# Patient Record
Sex: Female | Born: 1975 | Race: White | Hispanic: No | Marital: Single | Smoking: Never smoker
Health system: Southern US, Community
[De-identification: ages and names within clinical notes are randomized; demographics above are authoritative.]

---

## 1997-06-18 ENCOUNTER — Ambulatory Visit (HOSPITAL_COMMUNITY): Admission: RE | Admit: 1997-06-18 | Discharge: 1997-06-18 | Payer: Self-pay | Admitting: *Deleted

## 1997-08-18 ENCOUNTER — Ambulatory Visit (HOSPITAL_COMMUNITY): Admission: RE | Admit: 1997-08-18 | Discharge: 1997-08-18 | Payer: Self-pay | Admitting: *Deleted

## 1997-08-23 ENCOUNTER — Encounter: Admission: RE | Admit: 1997-08-23 | Discharge: 1997-11-21 | Payer: Self-pay | Admitting: *Deleted

## 1997-08-23 ENCOUNTER — Inpatient Hospital Stay (HOSPITAL_COMMUNITY): Admission: AD | Admit: 1997-08-23 | Discharge: 1997-08-23 | Payer: Self-pay | Admitting: Obstetrics

## 1997-09-06 ENCOUNTER — Inpatient Hospital Stay (HOSPITAL_COMMUNITY): Admission: AD | Admit: 1997-09-06 | Discharge: 1997-09-06 | Payer: Self-pay | Admitting: Obstetrics

## 1997-09-17 ENCOUNTER — Inpatient Hospital Stay (HOSPITAL_COMMUNITY): Admission: AD | Admit: 1997-09-17 | Discharge: 1997-09-17 | Payer: Self-pay | Admitting: *Deleted

## 1997-09-20 ENCOUNTER — Ambulatory Visit (HOSPITAL_COMMUNITY): Admission: RE | Admit: 1997-09-20 | Discharge: 1997-09-20 | Payer: Self-pay | Admitting: Obstetrics

## 1997-10-06 ENCOUNTER — Inpatient Hospital Stay (HOSPITAL_COMMUNITY): Admission: AD | Admit: 1997-10-06 | Discharge: 1997-10-08 | Payer: Self-pay | Admitting: *Deleted

## 1998-05-24 ENCOUNTER — Other Ambulatory Visit: Admission: RE | Admit: 1998-05-24 | Discharge: 1998-05-24 | Payer: Self-pay | Admitting: *Deleted

## 1998-07-05 ENCOUNTER — Ambulatory Visit (HOSPITAL_COMMUNITY): Admission: RE | Admit: 1998-07-05 | Discharge: 1998-07-05 | Payer: Self-pay | Admitting: *Deleted

## 1998-08-30 ENCOUNTER — Ambulatory Visit (HOSPITAL_COMMUNITY): Admission: RE | Admit: 1998-08-30 | Discharge: 1998-08-30 | Payer: Self-pay | Admitting: Obstetrics

## 1998-10-05 ENCOUNTER — Ambulatory Visit (HOSPITAL_COMMUNITY): Admission: RE | Admit: 1998-10-05 | Discharge: 1998-10-05 | Payer: Self-pay | Admitting: *Deleted

## 1998-10-11 ENCOUNTER — Inpatient Hospital Stay (HOSPITAL_COMMUNITY): Admission: AD | Admit: 1998-10-11 | Discharge: 1998-10-11 | Payer: Self-pay | Admitting: *Deleted

## 1998-10-12 ENCOUNTER — Encounter (HOSPITAL_COMMUNITY): Admission: RE | Admit: 1998-10-12 | Discharge: 1998-12-31 | Payer: Self-pay | Admitting: Obstetrics

## 1998-10-31 ENCOUNTER — Inpatient Hospital Stay (HOSPITAL_COMMUNITY): Admission: AD | Admit: 1998-10-31 | Discharge: 1998-10-31 | Payer: Self-pay | Admitting: Obstetrics

## 1998-11-04 ENCOUNTER — Inpatient Hospital Stay (HOSPITAL_COMMUNITY): Admission: AD | Admit: 1998-11-04 | Discharge: 1998-11-04 | Payer: Self-pay | Admitting: Obstetrics & Gynecology

## 1998-11-05 ENCOUNTER — Inpatient Hospital Stay (HOSPITAL_COMMUNITY): Admission: AD | Admit: 1998-11-05 | Discharge: 1998-11-05 | Payer: Self-pay | Admitting: Obstetrics

## 1998-12-09 ENCOUNTER — Ambulatory Visit (HOSPITAL_COMMUNITY): Admission: RE | Admit: 1998-12-09 | Discharge: 1998-12-09 | Payer: Self-pay | Admitting: Obstetrics

## 1998-12-10 ENCOUNTER — Inpatient Hospital Stay (HOSPITAL_COMMUNITY): Admission: AD | Admit: 1998-12-10 | Discharge: 1998-12-10 | Payer: Self-pay | Admitting: *Deleted

## 1998-12-22 ENCOUNTER — Inpatient Hospital Stay (HOSPITAL_COMMUNITY): Admission: AD | Admit: 1998-12-22 | Discharge: 1998-12-22 | Payer: Self-pay | Admitting: Obstetrics

## 1998-12-24 ENCOUNTER — Observation Stay (HOSPITAL_COMMUNITY): Admission: AD | Admit: 1998-12-24 | Discharge: 1998-12-25 | Payer: Self-pay | Admitting: Obstetrics

## 1998-12-26 ENCOUNTER — Inpatient Hospital Stay (HOSPITAL_COMMUNITY): Admission: AD | Admit: 1998-12-26 | Discharge: 1998-12-26 | Payer: Self-pay | Admitting: *Deleted

## 1998-12-29 ENCOUNTER — Inpatient Hospital Stay (HOSPITAL_COMMUNITY): Admission: AD | Admit: 1998-12-29 | Discharge: 1998-12-31 | Payer: Self-pay | Admitting: Obstetrics & Gynecology

## 1999-03-04 ENCOUNTER — Emergency Department (HOSPITAL_COMMUNITY): Admission: EM | Admit: 1999-03-04 | Discharge: 1999-03-04 | Payer: Self-pay | Admitting: Emergency Medicine

## 1999-03-11 ENCOUNTER — Emergency Department (HOSPITAL_COMMUNITY): Admission: EM | Admit: 1999-03-11 | Discharge: 1999-03-11 | Payer: Self-pay | Admitting: Emergency Medicine

## 2002-06-22 ENCOUNTER — Encounter: Admission: RE | Admit: 2002-06-22 | Discharge: 2002-06-22 | Payer: Self-pay | Admitting: Surgery

## 2002-06-22 ENCOUNTER — Encounter: Payer: Self-pay | Admitting: Surgery

## 2003-12-02 ENCOUNTER — Encounter: Admission: RE | Admit: 2003-12-02 | Discharge: 2003-12-02 | Payer: Self-pay | Admitting: Surgery

## 2004-07-15 ENCOUNTER — Inpatient Hospital Stay (HOSPITAL_COMMUNITY): Admission: AD | Admit: 2004-07-15 | Discharge: 2004-07-15 | Payer: Self-pay | Admitting: Obstetrics and Gynecology

## 2004-10-13 ENCOUNTER — Ambulatory Visit (HOSPITAL_COMMUNITY): Admission: RE | Admit: 2004-10-13 | Discharge: 2004-10-13 | Payer: Self-pay | Admitting: *Deleted

## 2004-10-26 ENCOUNTER — Ambulatory Visit (HOSPITAL_COMMUNITY): Admission: RE | Admit: 2004-10-26 | Discharge: 2004-10-26 | Payer: Self-pay | Admitting: *Deleted

## 2004-12-06 ENCOUNTER — Ambulatory Visit: Payer: Self-pay | Admitting: *Deleted

## 2004-12-06 ENCOUNTER — Inpatient Hospital Stay (HOSPITAL_COMMUNITY): Admission: AD | Admit: 2004-12-06 | Discharge: 2004-12-06 | Payer: Self-pay | Admitting: Obstetrics and Gynecology

## 2004-12-11 ENCOUNTER — Ambulatory Visit: Payer: Self-pay | Admitting: Obstetrics and Gynecology

## 2004-12-11 ENCOUNTER — Inpatient Hospital Stay (HOSPITAL_COMMUNITY): Admission: AD | Admit: 2004-12-11 | Discharge: 2004-12-11 | Payer: Self-pay | Admitting: Obstetrics & Gynecology

## 2004-12-13 ENCOUNTER — Ambulatory Visit: Payer: Self-pay | Admitting: *Deleted

## 2004-12-13 ENCOUNTER — Inpatient Hospital Stay (HOSPITAL_COMMUNITY): Admission: AD | Admit: 2004-12-13 | Discharge: 2004-12-16 | Payer: Self-pay | Admitting: Obstetrics and Gynecology

## 2004-12-20 ENCOUNTER — Ambulatory Visit: Payer: Self-pay | Admitting: *Deleted

## 2004-12-27 ENCOUNTER — Ambulatory Visit: Payer: Self-pay | Admitting: Family Medicine

## 2005-01-03 ENCOUNTER — Ambulatory Visit: Payer: Self-pay | Admitting: Obstetrics & Gynecology

## 2005-01-10 ENCOUNTER — Ambulatory Visit: Payer: Self-pay | Admitting: *Deleted

## 2005-01-10 ENCOUNTER — Ambulatory Visit (HOSPITAL_COMMUNITY): Admission: RE | Admit: 2005-01-10 | Discharge: 2005-01-10 | Payer: Self-pay | Admitting: Obstetrics and Gynecology

## 2005-01-13 IMAGING — CT CT ABDOMEN W/ CM
1 of 3 series · 14 of 32 positions shown, 19 images · IV contrast (GASTRO & OMNIPAQUE [ID])
Comparison: none

CLINICAL DATA: History of midabdomen pain.  Evaluate for possible hernia.  
 CT SCAN OF THE ABDOMEN WITH CONTRAST AND CT OF THE PELVIS WITH CONTRAST 
 CT ABDOMEN 
 Multidetector helical scans through the abdomen were performed after oral and IV contrast media were given.  100 cc of Omnipaque 300 were given as the contrast media. 
 The lung bases are clear.  The liver enhances normally with no focal abnormality and no ductal dilatation is seen.  No calcified gallstones are evident.  The pancreas is normal in size and the peripancreatic fat planes are normal.  The adrenal glands and spleen are normal.  Scanning through the kidneys, there are a few small foci of increased attenuation right greater than left most consistent with small renal calculi which are non-obstructing.  On delayed images the pelvocaliceal systems appear normal.  The abdominal aorta is normal in caliber and no adenopathy is seen.
 IMPRESSION
 1.  Negative CT of the abdomen for acute abnormality. 
 2.  Small non-obstructing renal calculi right greater than left.  
 CT PELVIS 
 Scans were continued through the pelvis after oral and IV contrast media were given.  The uterus is normal in size.  The urinary bladder is decompressed and cannot be evaluated.  Calcified phleboliths are noted in the pelvis.  Small rounded low attenuation areas in the adnexa are most consistent with small follicles.  The appendix is not seen with certainty.  The terminal ileum is normal.  No fluid is seen within the pelvis.  The rectosigmoid colon is grossly normal being decompressed.  No hernia is seen. 
 No acute abnormality on CT of the pelvis.  No hernia is seen.

[Series 2: routine abdomen · axial · 0.70mm/px · z∈[-386,-6]mm · 14 of 86 slices shown, 19 images]
[im 5/86  soft-tissue]
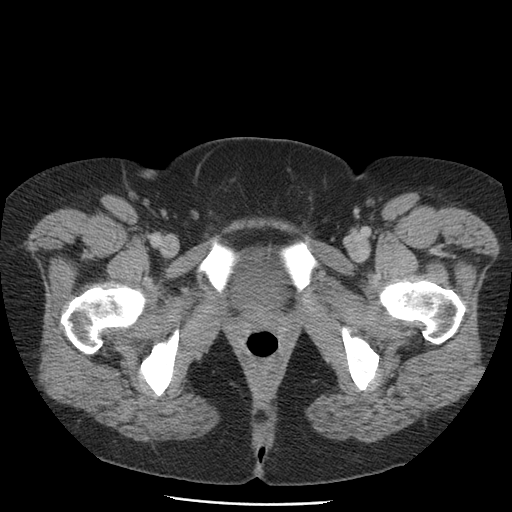
[im 5/86  bone]
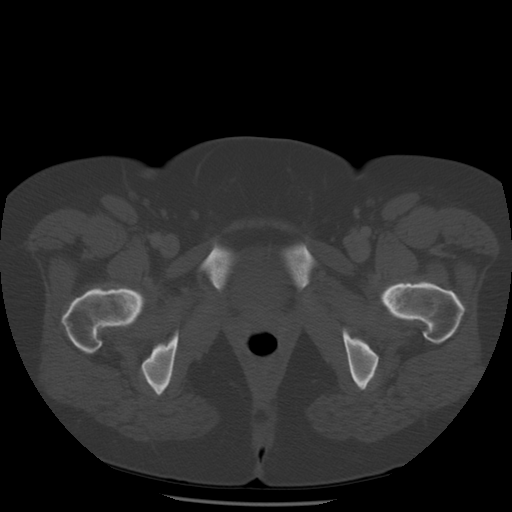
[im 13/86  soft-tissue]
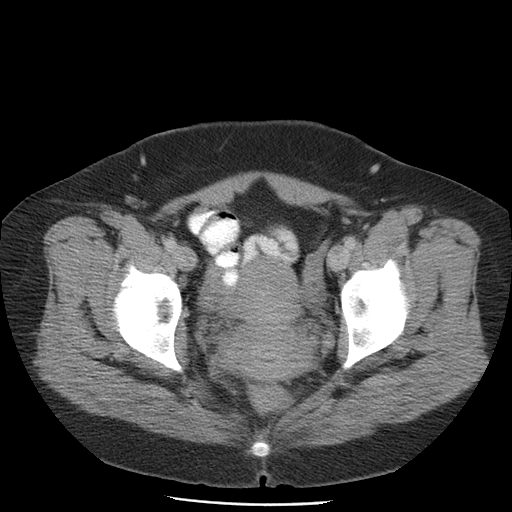
[im 18/86  soft-tissue]
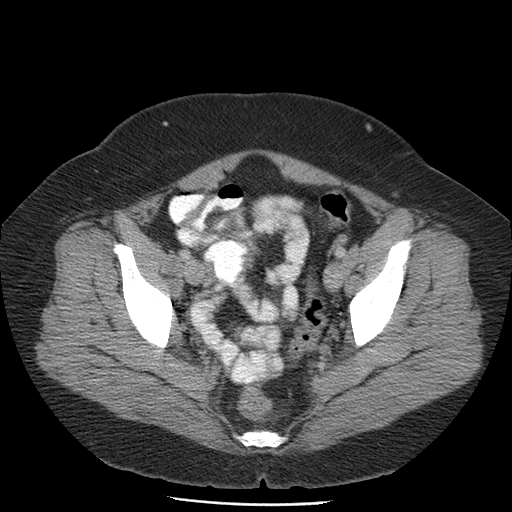
[im 26/86  soft-tissue]
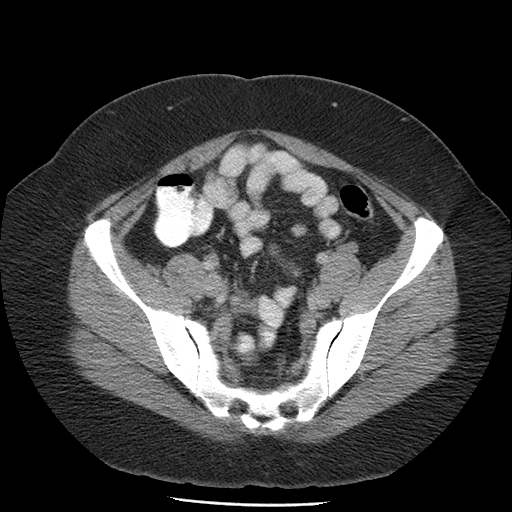
[im 30/86  soft-tissue]
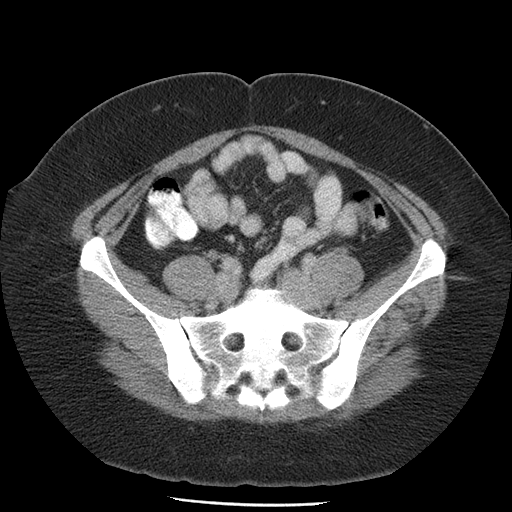
[im 39/86  soft-tissue]
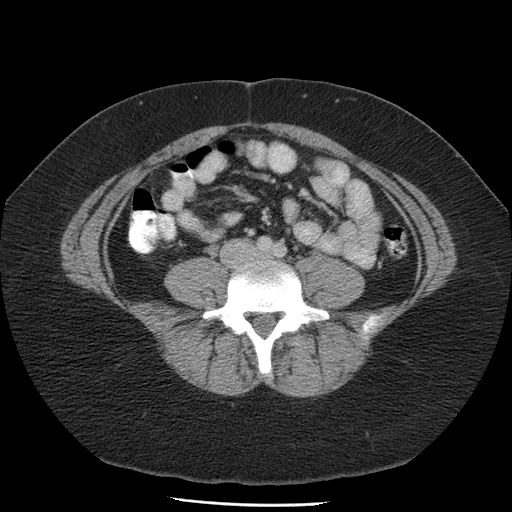
[im 43/86  soft-tissue]
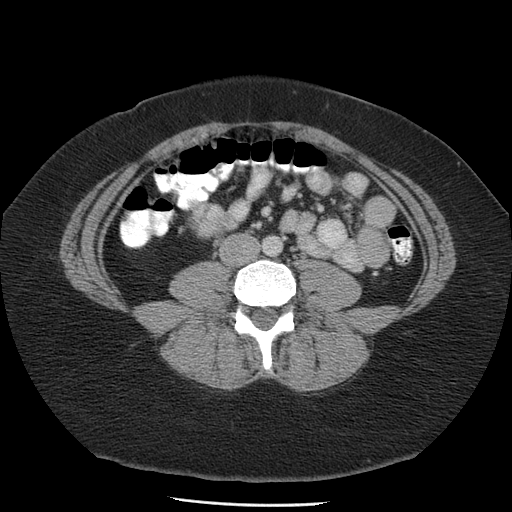
[im 47/86  soft-tissue]
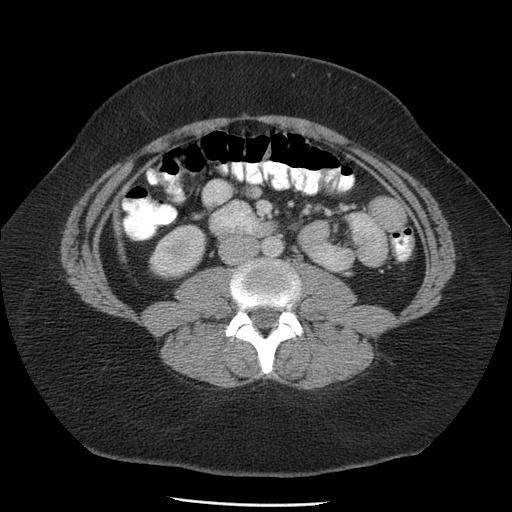
[im 56/86  soft-tissue]
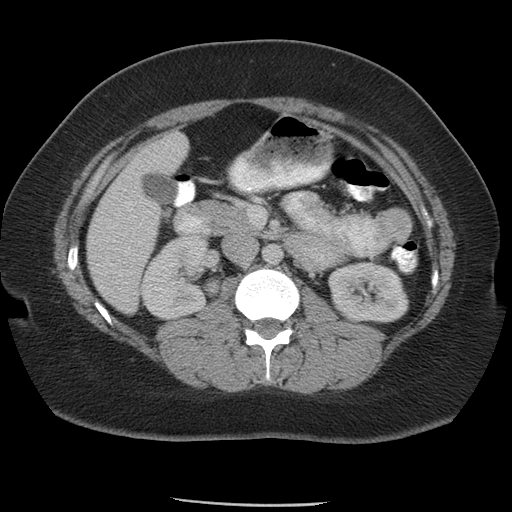
[im 56/86  bone]
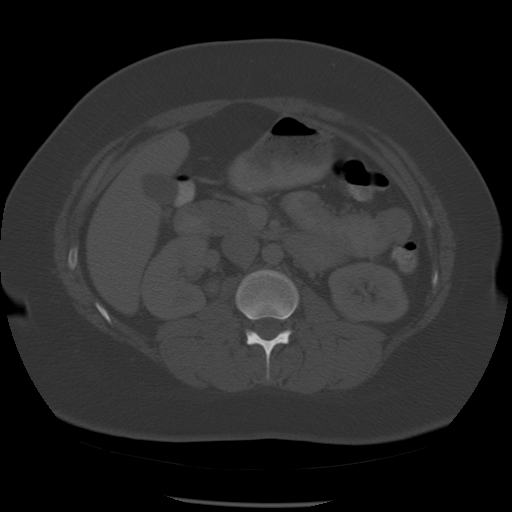
[im 60/86  soft-tissue]
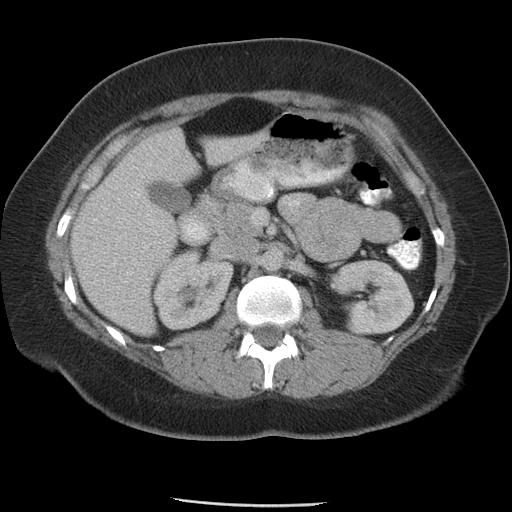
[im 69/86  soft-tissue]
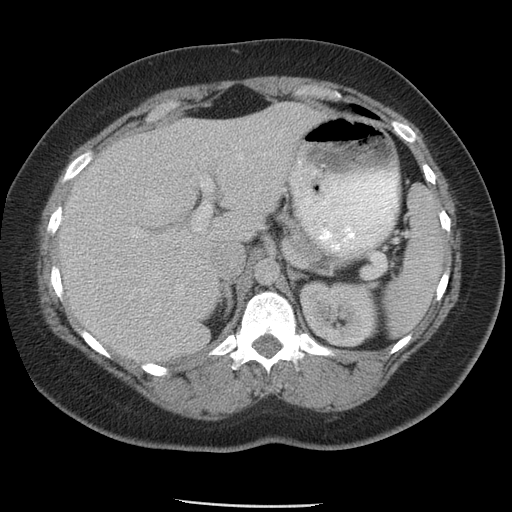
[im 69/86  lung]
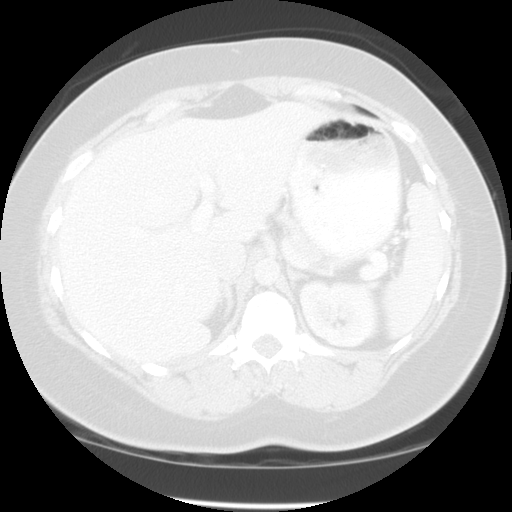
[im 73/86  soft-tissue]
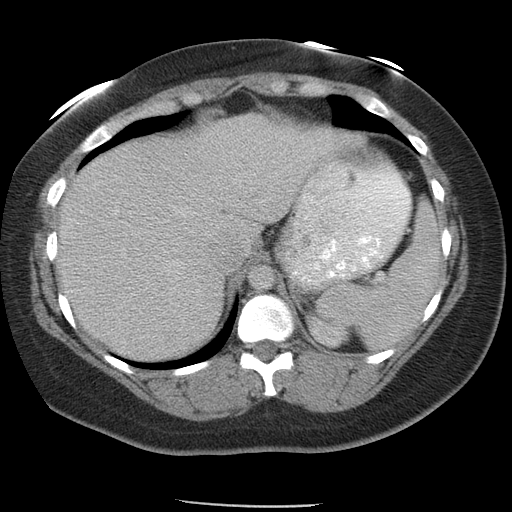
[im 73/86  lung]
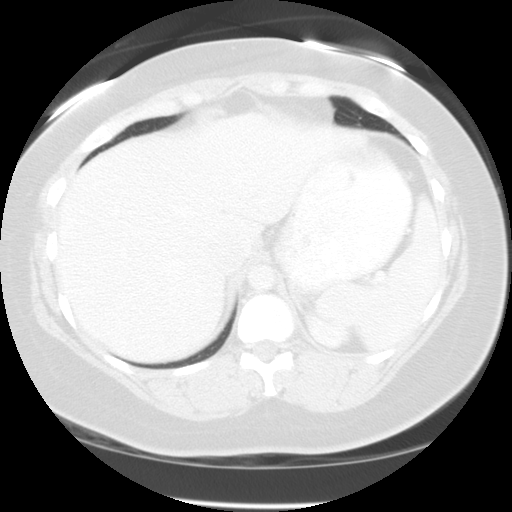
[im 77/86  lung]
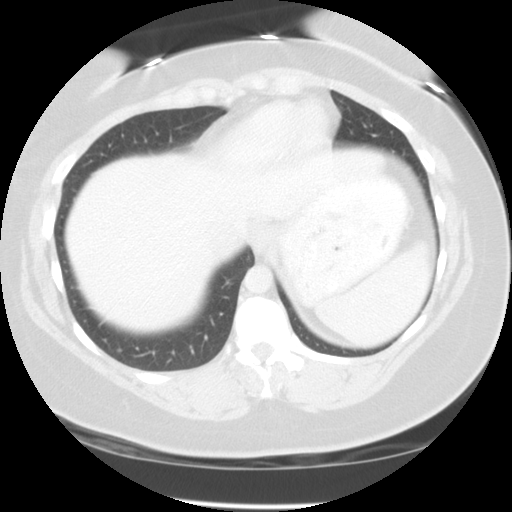
[im 81/86  soft-tissue]
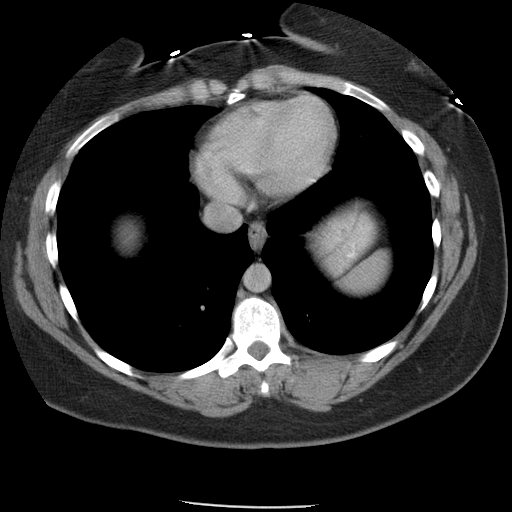
[im 81/86  lung]
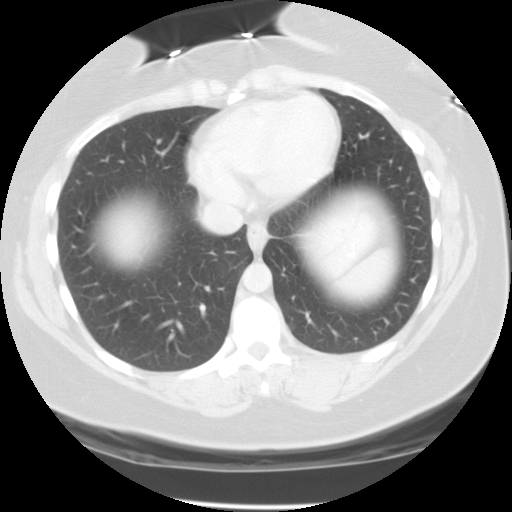

[14 of 32 positions shown; findings below may reference images not displayed]

## 2005-01-17 ENCOUNTER — Ambulatory Visit: Payer: Self-pay | Admitting: Obstetrics & Gynecology

## 2005-01-24 ENCOUNTER — Ambulatory Visit: Payer: Self-pay | Admitting: *Deleted

## 2005-02-07 ENCOUNTER — Ambulatory Visit: Payer: Self-pay | Admitting: *Deleted

## 2005-02-14 ENCOUNTER — Ambulatory Visit: Payer: Self-pay | Admitting: *Deleted

## 2005-02-15 ENCOUNTER — Ambulatory Visit (HOSPITAL_COMMUNITY): Admission: RE | Admit: 2005-02-15 | Discharge: 2005-02-15 | Payer: Self-pay | Admitting: *Deleted

## 2005-02-16 ENCOUNTER — Ambulatory Visit: Payer: Self-pay | Admitting: *Deleted

## 2005-02-18 ENCOUNTER — Ambulatory Visit: Payer: Self-pay | Admitting: Family Medicine

## 2005-02-18 ENCOUNTER — Inpatient Hospital Stay (HOSPITAL_COMMUNITY): Admission: AD | Admit: 2005-02-18 | Discharge: 2005-02-18 | Payer: Self-pay | Admitting: *Deleted

## 2005-02-21 ENCOUNTER — Ambulatory Visit: Payer: Self-pay | Admitting: Family Medicine

## 2005-02-23 ENCOUNTER — Ambulatory Visit: Payer: Self-pay | Admitting: *Deleted

## 2005-02-28 ENCOUNTER — Ambulatory Visit: Payer: Self-pay | Admitting: *Deleted

## 2005-03-02 ENCOUNTER — Ambulatory Visit: Payer: Self-pay | Admitting: *Deleted

## 2005-03-09 ENCOUNTER — Ambulatory Visit: Payer: Self-pay | Admitting: *Deleted

## 2005-03-09 ENCOUNTER — Inpatient Hospital Stay (HOSPITAL_COMMUNITY): Admission: AD | Admit: 2005-03-09 | Discharge: 2005-03-11 | Payer: Self-pay | Admitting: *Deleted

## 2006-01-18 IMAGING — US US OB FOLLOW-UP
1 series · 13 of 28 positions shown · non-contrast
Comparison: none

CLINICAL DATA: Contractions.  Premature labor.

[Series 1: us ob follow-up · 32 acquisitions, 13 frames shown]
[im 2/32]
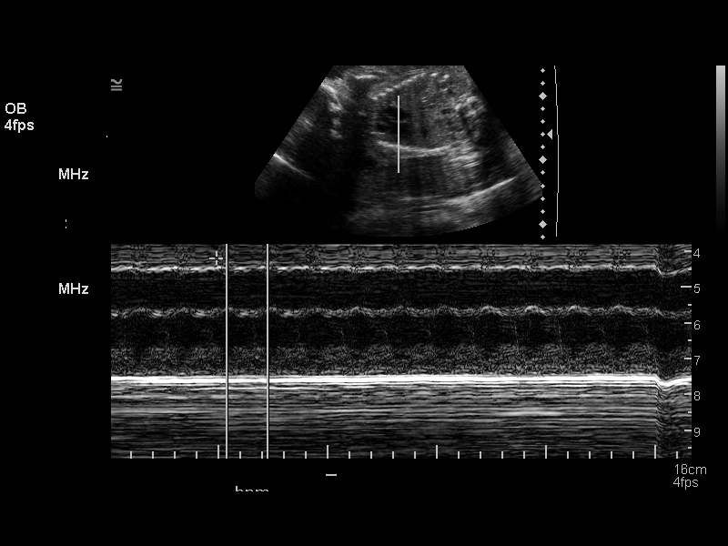
[im 4/32]
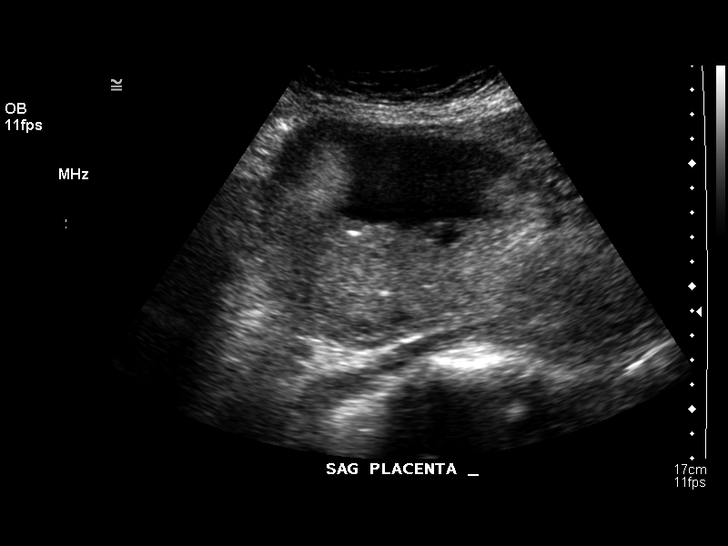
[im 6/32]
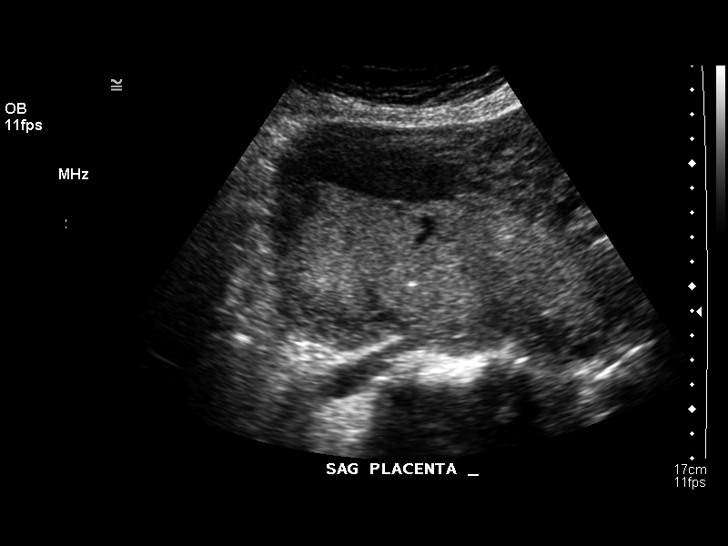
[im 9/32]
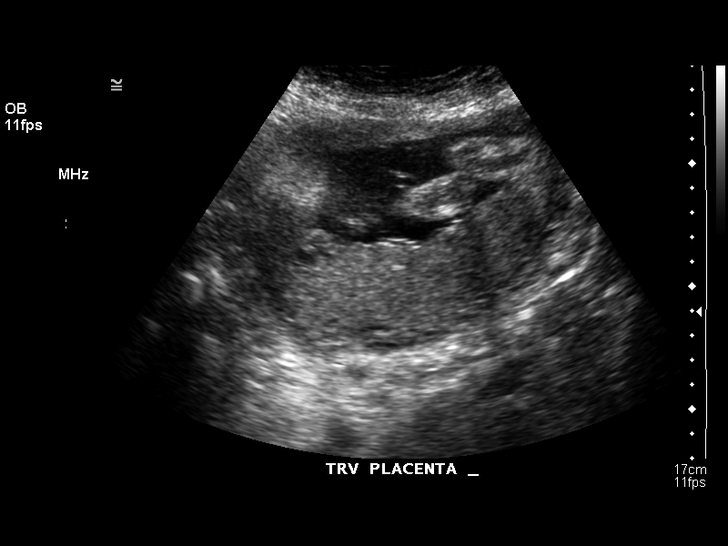
[im 11/32]
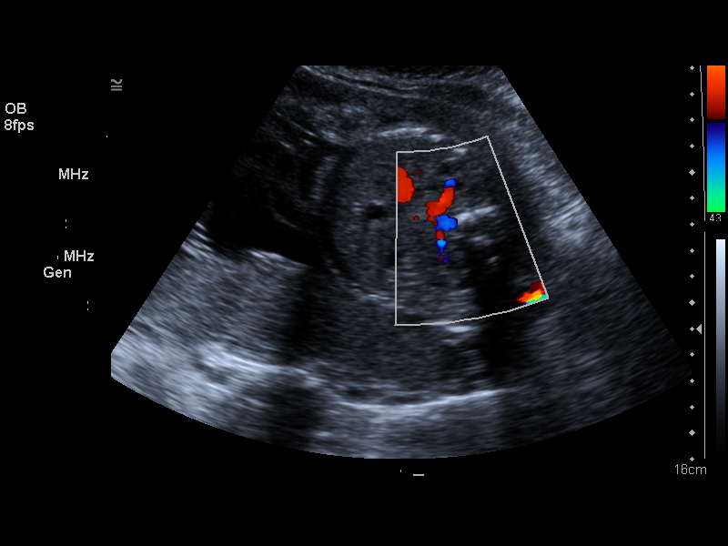
[im 13/32]
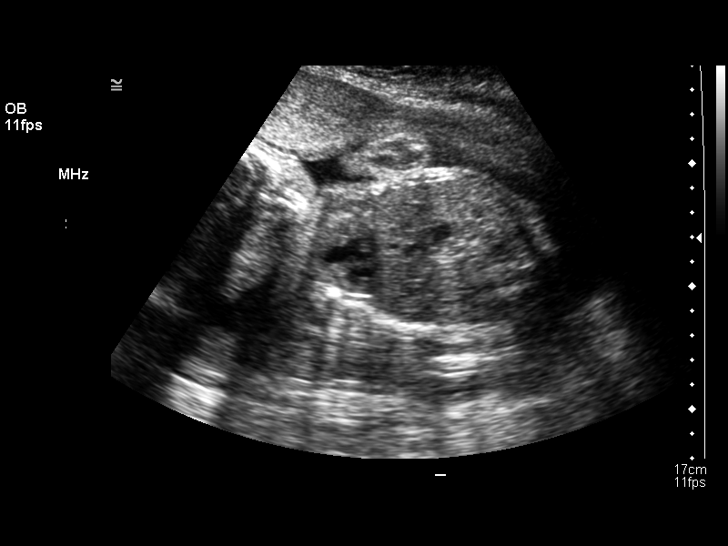
[im 17/32]
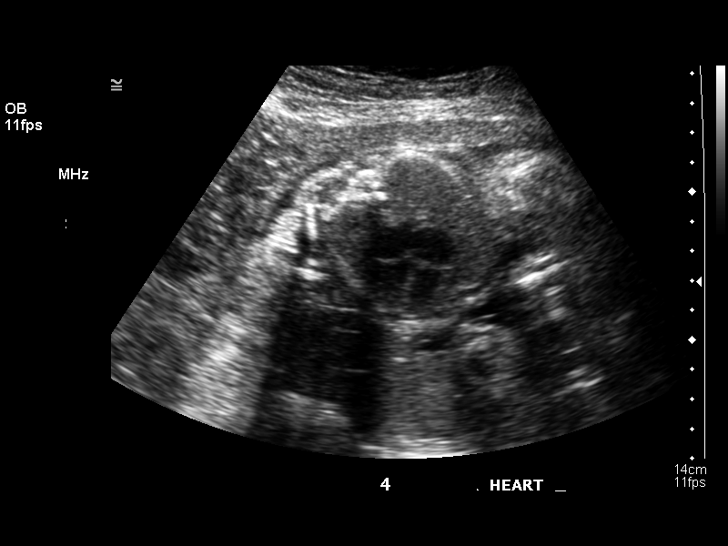
[im 19/32]
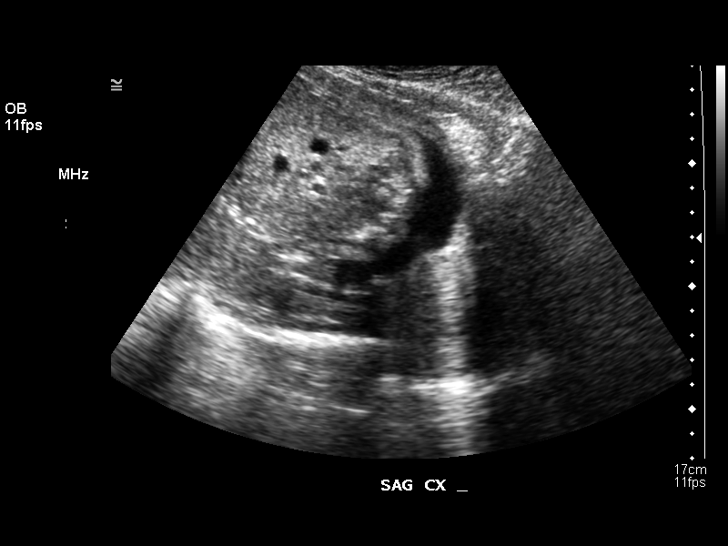
[im 21/32]
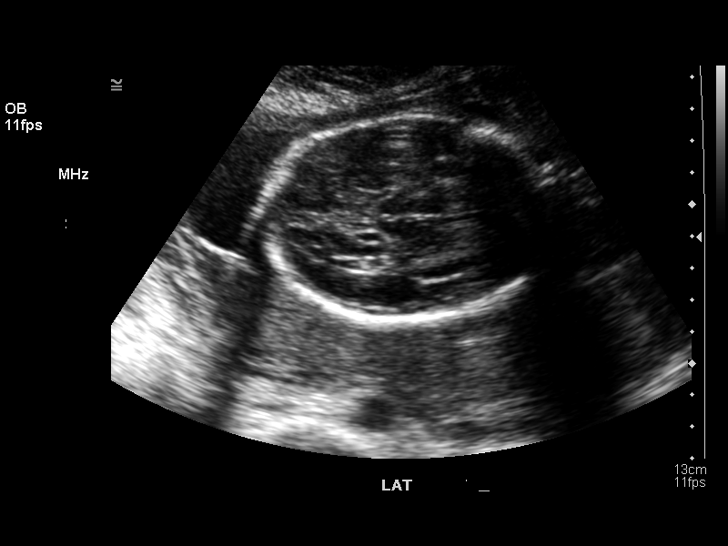
[im 23/32]
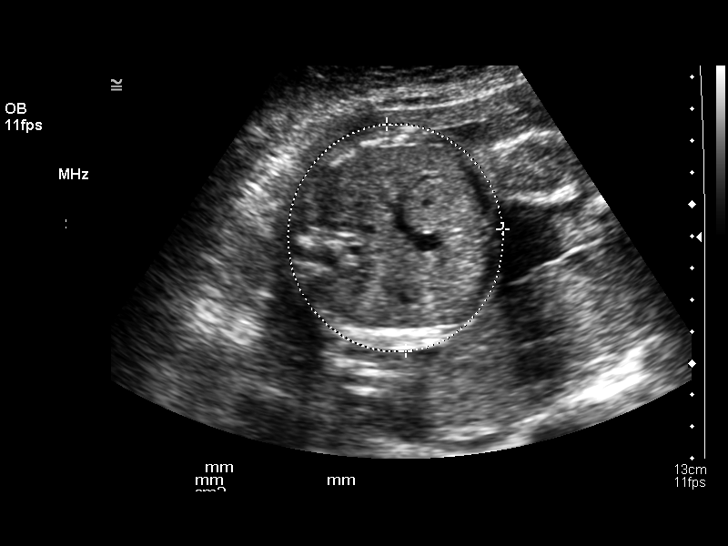
[im 26/32]
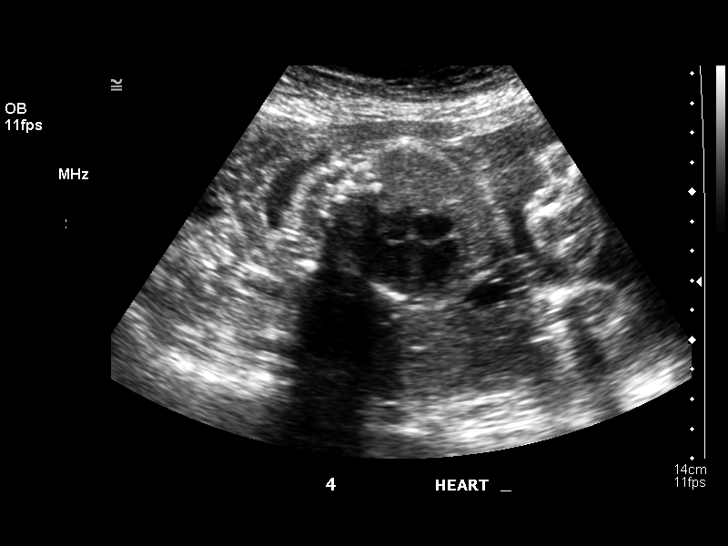
[im 28/32]
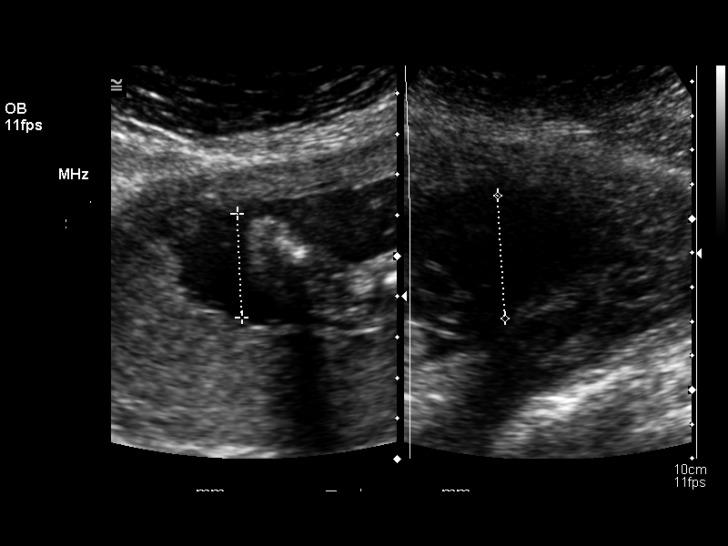
[im 30/32]
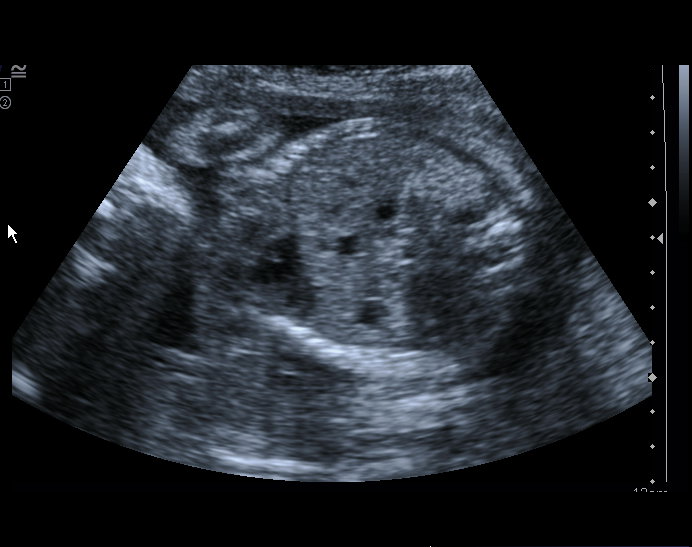

[13 of 28 positions shown; findings below may reference images not displayed]

OBSTETRICAL ULTRASOUND RE-EVALUATION:
 Number of Fetuses:  1
 Heart Rate:  160
 Movement:  Yes
 Breathing:  Yes
 Presentation:  Breech
 Placental Location:  Fundal, posterior
 Grade: I
 Previa:  No
 Amniotic Fluid (subjective):  Normal
 Amniotic Fluid (objective):  12.4 cm AFI (5th -95th%ile = 9.7 – 22.3 cm for 26 wks)

 FETAL BIOMETRY
 BPD:  6.4 cm  25 w 5 d
 HC:  25.4 cm   27 w 4 d
 AC:  21.5 cm  26 w 0 d
 FL:   4.8 cm  25 w 5 d

 Mean GA:  26 w 2 d
 Assigned GA:  26 w 0 d
 Fetal indices are within normal limits.
 EFW: 895 g (H) 75th – 90th%ile (885 – 3139 g) For 26 wks

 FETAL ANATOMY
 Lateral Ventricles:  Visualized 
 Thalami/CSP:  Visualized 
 Posterior Fossa:  Visualized 
 Nuchal Region:  Visualized 
 Spine:  Visualized 
 4 Chamber Heart on Left:  Visualized 
 Stomach on Left:  Visualized 
 3 Vessel Cord:  Visualized 
 Cord Insertion Site:  Visualized 
 Kidneys:  Visualized 
 Bladder:  Visualized 
 Extremities:  Visualized 

 MATERNAL UTERINE AND ADNEXAL FINDINGS
 Cervix:  4.7 cm Transabdominally

 BIOPHYSICAL PROFILE

 Movement:  2    Time:  30 minutes
 Breathing:  2
 Tone:  2
 Amniotic Fluid:  2

 Total Score:  8
IMPRESSION: 1.  Normal appearing single intrauterine pregnancy of approximately 26 weeks 2 days gestation.
 2.  Normal biophysical profile score [DATE].

## 2010-07-07 ENCOUNTER — Emergency Department (HOSPITAL_BASED_OUTPATIENT_CLINIC_OR_DEPARTMENT_OTHER)
Admission: EM | Admit: 2010-07-07 | Discharge: 2010-07-07 | Disposition: A | Discharge status: Home / Self Care | Payer: BC Managed Care – PPO | Attending: Emergency Medicine | Admitting: Emergency Medicine

## 2010-07-07 DIAGNOSIS — L0231 Cutaneous abscess of buttock: Secondary | ICD-10-CM | POA: Insufficient documentation

## 2010-07-07 DIAGNOSIS — K645 Perianal venous thrombosis: Secondary | ICD-10-CM | POA: Insufficient documentation

## 2010-07-07 DIAGNOSIS — L03317 Cellulitis of buttock: Secondary | ICD-10-CM | POA: Insufficient documentation

## 2013-11-09 ENCOUNTER — Other Ambulatory Visit (HOSPITAL_COMMUNITY)
Admission: RE | Admit: 2013-11-09 | Discharge: 2013-11-09 | Disposition: A | Discharge status: Home / Self Care | Payer: BC Managed Care – PPO | Source: Ambulatory Visit | Attending: Physician Assistant | Admitting: Physician Assistant

## 2013-11-09 ENCOUNTER — Other Ambulatory Visit: Payer: Self-pay | Admitting: Physician Assistant

## 2013-11-09 DIAGNOSIS — Z1151 Encounter for screening for human papillomavirus (HPV): Secondary | ICD-10-CM | POA: Insufficient documentation

## 2013-11-09 DIAGNOSIS — Z124 Encounter for screening for malignant neoplasm of cervix: Secondary | ICD-10-CM | POA: Insufficient documentation

## 2013-11-10 LAB — CYTOLOGY - PAP

## 2014-06-10 ENCOUNTER — Ambulatory Visit (INDEPENDENT_AMBULATORY_CARE_PROVIDER_SITE_OTHER): Payer: BC Managed Care – PPO | Admitting: Family Medicine

## 2014-06-10 VITALS — BP 124/88 | HR 80 | Temp 98.7°F | Resp 16 | Ht 68.0 in | Wt 207.6 lb

## 2014-06-10 DIAGNOSIS — N926 Irregular menstruation, unspecified: Secondary | ICD-10-CM

## 2014-06-10 DIAGNOSIS — R1912 Hyperactive bowel sounds: Secondary | ICD-10-CM

## 2014-06-10 DIAGNOSIS — R198 Other specified symptoms and signs involving the digestive system and abdomen: Secondary | ICD-10-CM

## 2014-06-10 DIAGNOSIS — R142 Eructation: Secondary | ICD-10-CM

## 2014-06-10 DIAGNOSIS — R110 Nausea: Secondary | ICD-10-CM

## 2014-06-10 DIAGNOSIS — Z113 Encounter for screening for infections with a predominantly sexual mode of transmission: Secondary | ICD-10-CM

## 2014-06-10 DIAGNOSIS — R1030 Lower abdominal pain, unspecified: Secondary | ICD-10-CM

## 2014-06-10 DIAGNOSIS — N3090 Cystitis, unspecified without hematuria: Secondary | ICD-10-CM

## 2014-06-10 LAB — POCT URINALYSIS DIPSTICK
GLUCOSE UA: NEGATIVE
KETONES UA: 15
Nitrite, UA: NEGATIVE
Protein, UA: 30
Urobilinogen, UA: 1
pH, UA: 5

## 2014-06-10 LAB — POCT CBC
GRANULOCYTE PERCENT: 78.2 % (ref 37–80)
HCT, POC: 43.7 % (ref 37.7–47.9)
Hemoglobin: 14.5 g/dL (ref 12.2–16.2)
Lymph, poc: 1.9 (ref 0.6–3.4)
MCH: 30.9 pg (ref 27–31.2)
MCHC: 33.2 g/dL (ref 31.8–35.4)
MCV: 93.2 fL (ref 80–97)
MID (CBC): 0.9 (ref 0–0.9)
MPV: 6.3 fL (ref 0–99.8)
PLATELET COUNT, POC: 302 10*3/uL (ref 142–424)
POC Granulocyte: 9.8 — AB (ref 2–6.9)
POC LYMPH PERCENT: 15 %L (ref 10–50)
POC MID %: 6.8 % (ref 0–12)
RBC: 4.69 M/uL (ref 4.04–5.48)
RDW, POC: 13.3 %
WBC: 12.5 10*3/uL — AB (ref 4.6–10.2)

## 2014-06-10 LAB — POCT URINE PREGNANCY: Preg Test, Ur: NEGATIVE

## 2014-06-10 LAB — POCT UA - MICROSCOPIC ONLY
CASTS, UR, LPF, POC: NEGATIVE
CRYSTALS, UR, HPF, POC: NEGATIVE
Yeast, UA: NEGATIVE

## 2014-06-10 MED ORDER — ONDANSETRON 4 MG PO TBDP: 4.0000 mg | ORAL_TABLET | Freq: Three times a day (TID) | ORAL | Status: DC | PRN

## 2014-06-10 MED ORDER — CEPHALEXIN 500 MG PO CAPS: ORAL_CAPSULE | ORAL | Status: DC

## 2014-06-10 NOTE — Patient Instructions (Signed)
Drink plenty of fluids and get enough rest  Take the antibiotic one pill twice daily for 5 days for the urinary infection  Take the ondansetron one pill every 6 hours if needed for nausea or excessive burping  Return at any time if abruptly worse  We will let you know the results of your additional labs and I few days.

## 2014-06-10 NOTE — Progress Notes (Signed)
Subjective: Patient is here with history of having had an episode about a week ago of abdominal pain. It was a generalized hurting. She has been burping a lot with a foul odor. She has not had any diarrhea but has had mushy stools. She has not had any dysuria. She has some generalized abdominal discomfort. She recently worked up for irregular menses during the last year. She had a menstrual cycle at Christmas and then a few weeks later, none since. She did have an episode of right condom rupturing, and would like STD testing done. She's been with same person for a long time and is really not extremely concerned about STD. She's not been running any fevers. There is been a gastroenteritis going to the school.  Objective: Throat clear. Neck supple without nodes. Chest clear. Heart regular without murmurs. Abdomen has soft bowel sounds. She describes having had a lot of borborygmi but the bowel sounds are very soft right now. No major tenderness.   Results for orders placed or performed in visit on 06/10/14  POCT CBC  Result Value Ref Range   WBC 12.5 (A) 4.6 - 10.2 K/uL   Lymph, poc 1.9 0.6 - 3.4   POC LYMPH PERCENT 15.0 10 - 50 %L   MID (cbc) 0.9 0 - 0.9   POC MID % 6.8 0 - 12 %M   POC Granulocyte 9.8 (A) 2 - 6.9   Granulocyte percent 78.2 37 - 80 %G   RBC 4.69 4.04 - 5.48 M/uL   Hemoglobin 14.5 12.2 - 16.2 g/dL   HCT, POC 10.243.7 72.537.7 - 47.9 %   MCV 93.2 80 - 97 fL   MCH, POC 30.9 27 - 31.2 pg   MCHC 33.2 31.8 - 35.4 g/dL   RDW, POC 36.613.3 %   Platelet Count, POC 302 142 - 424 K/uL   MPV 6.3 0 - 99.8 fL  POCT UA - Microscopic Only  Result Value Ref Range   WBC, Ur, HPF, POC 10-15    RBC, urine, microscopic 8-12    Bacteria, U Microscopic 2+    Mucus, UA 2+    Epithelial cells, urine per micros 5-10    Crystals, Ur, HPF, POC neg    Casts, Ur, LPF, POC neg    Yeast, UA neg   POCT urinalysis dipstick  Result Value Ref Range   Color, UA yellow    Clarity, UA cloudy    Glucose, UA neg     Bilirubin, UA small    Ketones, UA 15    Spec Grav, UA >=1.030    Blood, UA moderate    pH, UA 5.0    Protein, UA 30    Urobilinogen, UA 1.0    Nitrite, UA neg    Leukocytes, UA Trace   POCT urine pregnancy  Result Value Ref Range   Preg Test, Ur Negative    Assessment: Excessive burping Loose stools Irregular menses Generalized her [place] low abdominal pain Urinary tract infection  Plan: There is some Zofran for the queasiness and nausea and burping. Placed on Keflex 500 twice a day for urinary tract infection. Cultures pending.

## 2014-06-11 LAB — COMPREHENSIVE METABOLIC PANEL
ALT: 21 U/L (ref 0–35)
AST: 20 U/L (ref 0–37)
Albumin: 4.1 g/dL (ref 3.5–5.2)
Alkaline Phosphatase: 62 U/L (ref 39–117)
BILIRUBIN TOTAL: 0.8 mg/dL (ref 0.2–1.2)
BUN: 14 mg/dL (ref 6–23)
CO2: 24 mEq/L (ref 19–32)
CREATININE: 0.76 mg/dL (ref 0.50–1.10)
Calcium: 9.2 mg/dL (ref 8.4–10.5)
Chloride: 101 mEq/L (ref 96–112)
GLUCOSE: 97 mg/dL (ref 70–99)
POTASSIUM: 4 meq/L (ref 3.5–5.3)
SODIUM: 136 meq/L (ref 135–145)
TOTAL PROTEIN: 7.1 g/dL (ref 6.0–8.3)

## 2014-06-11 LAB — HIV ANTIBODY (ROUTINE TESTING W REFLEX): HIV: NONREACTIVE

## 2014-06-11 LAB — GC/CHLAMYDIA PROBE AMP
CT PROBE, AMP APTIMA: NEGATIVE
GC PROBE AMP APTIMA: NEGATIVE

## 2014-06-11 LAB — RPR

## 2014-06-12 LAB — URINE CULTURE
Colony Count: NO GROWTH
ORGANISM ID, BACTERIA: NO GROWTH

## 2014-06-14 ENCOUNTER — Telehealth: Payer: Self-pay

## 2014-06-14 NOTE — Telephone Encounter (Signed)
Pt calling about urine culture results. Please review. Thanks

## 2014-06-15 NOTE — Telephone Encounter (Signed)
See labs 

## 2014-06-15 NOTE — Telephone Encounter (Signed)
  Call: Urine culture had no growth.  Sorry I did not let her know.

## 2017-03-05 ENCOUNTER — Other Ambulatory Visit (HOSPITAL_COMMUNITY)
Admission: RE | Admit: 2017-03-05 | Discharge: 2017-03-05 | Disposition: A | Discharge status: Home / Self Care | Payer: BC Managed Care – PPO | Source: Ambulatory Visit | Attending: Family Medicine | Admitting: Family Medicine

## 2017-03-05 ENCOUNTER — Other Ambulatory Visit: Payer: Self-pay | Admitting: Family Medicine

## 2017-03-05 DIAGNOSIS — Z124 Encounter for screening for malignant neoplasm of cervix: Secondary | ICD-10-CM | POA: Insufficient documentation

## 2017-03-12 LAB — CYTOLOGY - PAP
Chlamydia: NEGATIVE
HPV: DETECTED — AB
Neisseria Gonorrhea: NEGATIVE

## 2019-07-03 ENCOUNTER — Other Ambulatory Visit: Payer: Self-pay | Admitting: Obstetrics & Gynecology

## 2019-11-11 ENCOUNTER — Other Ambulatory Visit: Payer: Self-pay

## 2019-11-11 ENCOUNTER — Encounter (HOSPITAL_COMMUNITY): Payer: Self-pay | Admitting: Emergency Medicine

## 2019-11-11 ENCOUNTER — Ambulatory Visit (HOSPITAL_COMMUNITY)
Admission: EM | Admit: 2019-11-11 | Discharge: 2019-11-11 | Disposition: A | Discharge status: Home / Self Care | Payer: BC Managed Care – PPO | Attending: Urgent Care | Admitting: Urgent Care

## 2019-11-11 DIAGNOSIS — S0181XA Laceration without foreign body of other part of head, initial encounter: Secondary | ICD-10-CM

## 2019-11-11 DIAGNOSIS — R519 Headache, unspecified: Secondary | ICD-10-CM | POA: Diagnosis not present

## 2019-11-11 MED ORDER — LIDOCAINE-EPINEPHRINE 1 %-1:100000 IJ SOLN
INTRAMUSCULAR | Status: AC
  Filled 2019-11-11: qty 1

## 2019-11-11 NOTE — ED Triage Notes (Signed)
laceration to center forehead.  Patient was cutting limb from tree, hit self with limb  Bleeding controlled.

## 2019-11-11 NOTE — ED Provider Notes (Signed)
[facility]   MRN: [id number] DOB: 1975-08-18  Subjective:   Susan Poole is a 44 y.o. female presenting for suffering a laceration to her forehead from cutting tree branch.  Patient denies losing consciousness.  Wound was cleaned in the clinic.  She is not interested in having a Tdap.  Denies history of high blood pressure.  States that normally it is in the low 100s.  However, she recently started OCP and ever since then has had elevations in her blood pressure.  She is taking Lo Loestrin.    Allergies  Allergen Reactions  . Latex     History reviewed. No pertinent past medical history.   Past Surgical History:  Procedure Laterality Date  . TUBAL LIGATION      History reviewed. No pertinent family history.  Social History   Tobacco Use  . Smoking status: Never Smoker  Substance Use Topics  . Alcohol use: No    Alcohol/week: 0.0 standard drinks  . Drug use: No    ROS   Objective:   Vitals: BP (!) 171/103 (BP Location: Left Arm)   Pulse 77   Temp 99.6 F (37.6 C) (Oral)   Resp 16   SpO2 100%   BP recheck was 165/105.  Physical Exam Constitutional:      General: She is not in acute distress.    Appearance: Normal appearance. She is well-developed. She is not ill-appearing, toxic-appearing or diaphoretic.  HENT:     Head: Normocephalic and atraumatic.      Nose: Nose normal.     Mouth/Throat:     Mouth: Mucous membranes are moist.     Pharynx: Oropharynx is clear.  Eyes:     General: No scleral icterus.    Extraocular Movements: Extraocular movements intact.     Pupils: Pupils are equal, round, and reactive to light.  Cardiovascular:     Rate and Rhythm: Normal rate.  Pulmonary:     Effort: Pulmonary effort is normal.  Skin:    General: Skin is warm and dry.  Neurological:     General: No focal deficit present.     Mental Status: She is alert and oriented to person, place, and time.     Cranial Nerves: No cranial nerve deficit.      Motor: No weakness.     Coordination: Coordination normal.     Gait: Gait normal.     Deep Tendon Reflexes: Reflexes normal.     Comments: Negative Romberg and pronator drift.  Psychiatric:        Mood and Affect: Mood normal.        Behavior: Behavior normal.     PROCEDURE NOTE: laceration repair Verbal consent obtained from patient.  Local anesthesia with 3cc Lidocaine 1% with epinephrine.  Wound explored for tendon, ligament damage. Wound scrubbed with soap and water and rinsed. Wound closed with #5 7-0 Prolene (4 simple interrupted, one horizontal mattress) sutures.  Wound cleansed and dressed.   Assessment and Plan :   PDMP not reviewed this encounter.  1. Facial pain   2. Facial laceration, initial encounter     Laceration repaired successfully using 7-0 Prolene sutures.  Counseled on wound care.  Patient refused Tdap.  Will monitor for her blood pressure, no signs of stroke.  Recheck in 6 days for suture removal, blood pressure recheck.  Counseled patient on potential for adverse effects with medications prescribed/recommended today, ER and return-to-clinic precautions discussed, patient verbalized understanding.    Wallis Bamberg,  PA-C 11/11/19 [id number]

## 2019-11-11 NOTE — Discharge Instructions (Addendum)

## 2019-11-17 ENCOUNTER — Ambulatory Visit (HOSPITAL_COMMUNITY)
Admission: EM | Admit: 2019-11-17 | Discharge: 2019-11-17 | Disposition: A | Discharge status: Home / Self Care | Payer: BC Managed Care – PPO

## 2019-11-17 NOTE — ED Notes (Signed)
Patient seen by provider only

## 2019-11-17 NOTE — ED Triage Notes (Signed)
Seen  by provider only

## 2024-05-06 ENCOUNTER — Other Ambulatory Visit (HOSPITAL_COMMUNITY): Payer: Self-pay | Admitting: Physician Assistant

## 2024-05-06 ENCOUNTER — Ambulatory Visit (HOSPITAL_COMMUNITY)
Admission: RE | Admit: 2024-05-06 | Discharge: 2024-05-06 | Disposition: A | Discharge status: Home / Self Care | Source: Ambulatory Visit | Attending: Surgery | Admitting: Surgery

## 2024-05-06 DIAGNOSIS — M79661 Pain in right lower leg: Secondary | ICD-10-CM | POA: Diagnosis not present
